# Patient Record
Sex: Female | Born: 1965 | Race: White | Hispanic: Yes | Smoking: Never smoker
Health system: Southern US, Community
[De-identification: ages and names within clinical notes are randomized; demographics above are authoritative.]

## PROBLEM LIST (undated history)

## (undated) DIAGNOSIS — M1990 Unspecified osteoarthritis, unspecified site: Secondary | ICD-10-CM

## (undated) HISTORY — PX: KNEE SURGERY: SHX244

---

## 2002-06-12 ENCOUNTER — Emergency Department (HOSPITAL_COMMUNITY): Admission: EM | Admit: 2002-06-12 | Discharge: 2002-06-12 | Payer: Self-pay | Admitting: Emergency Medicine

## 2006-11-03 ENCOUNTER — Ambulatory Visit: Payer: Self-pay | Admitting: Family Medicine

## 2006-11-06 ENCOUNTER — Ambulatory Visit: Payer: Self-pay | Admitting: *Deleted

## 2006-11-24 ENCOUNTER — Ambulatory Visit: Payer: Self-pay | Admitting: Family Medicine

## 2007-05-03 ENCOUNTER — Encounter (INDEPENDENT_AMBULATORY_CARE_PROVIDER_SITE_OTHER): Payer: Self-pay | Admitting: Family Medicine

## 2007-05-03 DIAGNOSIS — M25519 Pain in unspecified shoulder: Secondary | ICD-10-CM

## 2013-11-29 ENCOUNTER — Emergency Department (HOSPITAL_COMMUNITY)
Admission: EM | Admit: 2013-11-29 | Discharge: 2013-11-30 | Disposition: A | Discharge status: Home / Self Care | Payer: Self-pay | Attending: Emergency Medicine | Admitting: Emergency Medicine

## 2013-11-29 ENCOUNTER — Encounter (HOSPITAL_COMMUNITY): Payer: Self-pay | Admitting: Emergency Medicine

## 2013-11-29 ENCOUNTER — Emergency Department (HOSPITAL_COMMUNITY): Payer: Self-pay

## 2013-11-29 DIAGNOSIS — R112 Nausea with vomiting, unspecified: Secondary | ICD-10-CM | POA: Insufficient documentation

## 2013-11-29 DIAGNOSIS — R42 Dizziness and giddiness: Secondary | ICD-10-CM | POA: Insufficient documentation

## 2013-11-29 DIAGNOSIS — R1110 Vomiting, unspecified: Secondary | ICD-10-CM

## 2013-11-29 DIAGNOSIS — R197 Diarrhea, unspecified: Secondary | ICD-10-CM | POA: Insufficient documentation

## 2013-11-29 DIAGNOSIS — H5500 Unspecified nystagmus: Secondary | ICD-10-CM | POA: Insufficient documentation

## 2013-11-29 LAB — BASIC METABOLIC PANEL
BUN: 15 mg/dL (ref 6–23)
CO2: 24 mEq/L (ref 19–32)
Calcium: 9.2 mg/dL (ref 8.4–10.5)
Chloride: 102 mEq/L (ref 96–112)
Creatinine, Ser: 0.84 mg/dL (ref 0.50–1.10)
GFR calc non Af Amer: 81 mL/min — ABNORMAL LOW (ref >90)
Glucose, Bld: 99 mg/dL (ref 70–99)
POTASSIUM: 4.1 meq/L (ref 3.7–5.3)
Sodium: 137 mEq/L (ref 137–147)

## 2013-11-29 LAB — CBC WITH DIFFERENTIAL/PLATELET
Basophils Absolute: 0 10*3/uL (ref 0.0–0.1)
Basophils Relative: 0 % (ref 0–1)
Eosinophils Absolute: 0.1 10*3/uL (ref 0.0–0.7)
Eosinophils Relative: 1 % (ref 0–5)
HEMATOCRIT: 36.1 % (ref 36.0–46.0)
HEMOGLOBIN: 12.4 g/dL (ref 12.0–15.0)
Lymphocytes Relative: 35 % (ref 12–46)
Lymphs Abs: 2.2 10*3/uL (ref 0.7–4.0)
MCH: 30.5 pg (ref 26.0–34.0)
MCHC: 34.3 g/dL (ref 30.0–36.0)
MCV: 88.7 fL (ref 78.0–100.0)
MONO ABS: 0.3 10*3/uL (ref 0.1–1.0)
MONOS PCT: 5 % (ref 3–12)
Neutro Abs: 3.6 10*3/uL (ref 1.7–7.7)
Neutrophils Relative %: 58 % (ref 43–77)
Platelets: 277 10*3/uL (ref 150–400)
RBC: 4.07 MIL/uL (ref 3.87–5.11)
RDW: 13.8 % (ref 11.5–15.5)
WBC: 6.3 10*3/uL (ref 4.0–10.5)

## 2013-11-29 LAB — URINALYSIS, ROUTINE W REFLEX MICROSCOPIC
BILIRUBIN URINE: NEGATIVE
Glucose, UA: NEGATIVE mg/dL
Hgb urine dipstick: NEGATIVE
Ketones, ur: NEGATIVE mg/dL
Leukocytes, UA: NEGATIVE
NITRITE: NEGATIVE
PH: 6.5 (ref 5.0–8.0)
Protein, ur: NEGATIVE mg/dL
Specific Gravity, Urine: 1.011 (ref 1.005–1.030)
Urobilinogen, UA: 0.2 mg/dL (ref 0.0–1.0)

## 2013-11-29 MED ORDER — DIPHENOXYLATE-ATROPINE 2.5-0.025 MG PO TABS: 2.0000 | ORAL_TABLET | Freq: Four times a day (QID) | ORAL | Status: DC | PRN

## 2013-11-29 MED ORDER — SODIUM CHLORIDE 0.9 % IV BOLUS (SEPSIS)
1000.0000 mL | Freq: Once | INTRAVENOUS | Status: AC
  Administered 2013-11-29: 1000 mL via INTRAVENOUS

## 2013-11-29 MED ORDER — DIAZEPAM 5 MG/ML IJ SOLN
5.0000 mg | Freq: Once | INTRAMUSCULAR | Status: AC
  Administered 2013-11-29: 5 mg via INTRAVENOUS
  Filled 2013-11-29: qty 2

## 2013-11-29 MED ORDER — ONDANSETRON HCL 4 MG/2ML IJ SOLN
4.0000 mg | Freq: Once | INTRAMUSCULAR | Status: AC
  Administered 2013-11-29: 4 mg via INTRAVENOUS
  Filled 2013-11-29: qty 2

## 2013-11-29 MED ORDER — ONDANSETRON 4 MG PO TBDP
8.0000 mg | ORAL_TABLET | Freq: Once | ORAL | Status: AC
  Administered 2013-11-29: 8 mg via ORAL
  Filled 2013-11-29: qty 2

## 2013-11-29 MED ORDER — PROMETHAZINE HCL 25 MG PO TABS: 25.0000 mg | ORAL_TABLET | Freq: Four times a day (QID) | ORAL | Status: DC | PRN

## 2013-11-29 MED ORDER — MECLIZINE HCL 25 MG PO TABS: 25.0000 mg | ORAL_TABLET | Freq: Three times a day (TID) | ORAL | Status: DC | PRN

## 2013-11-29 NOTE — ED Notes (Addendum)
C/o dizziness. Whenever she feels dizzy her head hurts. Constant, but fluctuates. Describes as pressure. Denies facial pain/pressure. Can't move fast b/c she gets more dizzy. Admits to nvd. Denies fever. Last emesis PTA. Last diarrhea today. nvd onset Wednesday. Language barrier. Family translating well.  Agreeable to language line interpreter service when offered.

## 2013-11-29 NOTE — ED Notes (Signed)
MD at bedside. 

## 2013-11-29 NOTE — Discharge Instructions (Signed)
Diarrea  (Diarrhea) La diarrea es la materia fecal (heces) acuosas. Puede hacerlo sentir dbil, cansado, sediento, o con la boca seca (signos de deshidratacin). La materia fecal acuosa es signo de otro problema, generalmente una infeccin. Suele durar 2 o 3 das. Puede durar ms tiempo si es el signo de una enfermedad grave. Cudese segn lo que le indique su mdico.  CUIDADOS EN EL HOGAR   Beba 1 taza (8 onzas) de lquido cada vez que tenga una deposicin acuosa.  No beba los siguientes lquidos:  Los que contengan azcares simples (fructosa, glucosa, galactosa, [place], sacarosa, maltosa).  Bebidas deportivas.  Jugos de fruta.  Productos lcteos enteros.  Gaseosas.  Bebidas con cafena (caf, t, gaseosas) o alcohol.  Puede usar soluciones de rehidratacin oral si su mdico lo autoriza. Usted mismo puede preparar la solucin. Siga esta receta:   -  cucharadita de sal.   cucharadita de bicarbonato de soda.   de cucharadita de sal sustituta que contenga cloruro de potasio.  1  cucharada de azcar.  1 litros (34 oz) de agua.  Evite los siguientes alimentos:  Los que tienen gran contenido de [place], como frutas y verduras.  Frutas secas, semillas y panes y cereales integrales.  Las endulzadas con alcohol de azcar (xylitol, sorbitol, manitol).  Trate de comer los siguientes alimentos:  Los que tienen almidn, como arroz, pan, pasta, cereales bajos en azcar, avena, smola de maz, papas al horno, galletas y panecillos.  Bananas.  Pur de [employer].  Consuma alimentos ricos en probiticos, como yogur y productos lcteos fermentados.  Lvese bien las manos cada vez que tenga una deposicin acuosa.  Slo tome los medicamentos que le haya indicado su mdico.  Tome un bao caliente para ayudar a Engineer, manufacturing systems ardor o dolor que producen las deposiciones aguadas. SOLICITE AYUDA DE INMEDIATO SI:   No puede beber lquidos sin devolver vomitar.  Sigue vomitando.  Tiene  sangre en la materia fecal, o es de color negro y de aspecto alquitranado.  No hay emisin de [place] durante 6 a 8 horas o elimina una pequea cantidad de [place].  Usted tiene dolor de estmago (abdominal) que empeora o se mantiene en el mismo lugar (localiza).  Se siente dbil, mareado, confundido o se desmaya.  Siente un dolor de cabeza muy intenso.  La materia fecal acuosa no mejora.  Tiene fiebre o sntomas que persisten durante ms de 2-3 das.  Tiene fiebre y los sntomas empeoran de manera sbita. ASEGRESE DE QUE:   Comprende estas instrucciones.  Controlar su enfermedad.  Solicitar ayuda de inmediato si no mejora o si empeora. Document Released: 08/08/2012 [facility] Patient Information 2014 [place], [place].  Nuseas y vmitos (Nausea and Vomiting)  Naseas significa que tiene ganas de vomitar. Elvmitoes un reflejo por el que los contenidos del estmago salen por la boca.  CUIDADOS EN EL HOGAR  Tome los medicamentos como le indic el mdico.  No se esfuerce por comer. Sin embargo, es necesario que tome lquidos.  Si tiene ganas de comer, [place] dieta normal, segn las indicaciones del mdico.  Coma arroz, trigo, patatas, pan, carnes magras, yogur, frutas y vegetales.  Evite los alimentos [employer].  Beba gran cantidad de lquidos para mantener la orina de tono claro o color amarillo plido.  Consulte a su mdico como reponer la prdida de lquidos (rehidratacin). Los signos de prdida de lquidos (deshidratacin) son:  Chief of Staff sed.  Tiene los labios y la boca secos.  Est mareado.  La orina es Foster Centeroscura.  Orina menos que lo normal.  Se siente confundido.  La respiracin o la frecuencia cardaca son rpidas. SOLICITE AYUDA DE INMEDIATO SI:  Observa sangre en su vmito.  La materia fecal (heces)es negra o de color rojo.  Siente un dolor de cabeza muy intenso o el cuello rgido.  Se siente confundido.  Siente dolor muy fuerte  en el vientre (abdominal).  Tiene dolor en el pecho o dificultad para respirar.  No ha orinado durante 8 horas.  Tiene la piel fra y pegajosa.  Sigue vomitando despus de 24  48 horas.  Tiene fiebre. ASEGRESE QUE:  Comprende estas instrucciones.  Controlar la enfermedad.  Puede solicitar ayuda de inmediato si los sntomas no mejoran, o si empeoran. Document Released: 02/09/2010 Document Revised: 11/14/2011 [facility] Patient Information 2014 [place], [place].  Vrtigo (Vertigo)  Vrtigo es la sensacin de que se est moviendo estando quieto. Puede sentir Starwood Hotelsque las cosas a su alrededor se mueven cuando no lo hacen. Este problema desaparece por s mismo.  CUIDADOS EN EL HOGAR   Siga las indicaciones del mdico.  Evite conducir vehculos.  Evite usar Anheuser-Buschmaquinarias pesadas.  Evite realizar Anadarko Petroleum Corporationactividades que puedan ser peligrosas.  Informe a su mdico si los medicamentos le causan vrtigo. SOLICITE AYUDA DE INMEDIATO SI:   Los medicamentos no lo ayudan o lo Dietitianhacen sentir peor.  Tiene dificultad para hablar o para caminar.  Se siente dbil o tiene problemas para Boeingusar los brazos, las manos o las piernas.  Tiene dolores de [place].  Tiene Programme researcher, broadcasting/film/videomalestar estomacal (nuseas) y vmitos.  Hay cambios en la visin.  Algn familiar nota que usted tiene Lehman Brotherscambios en su conducta.  Los sntomas empeoran. ASEGRESE DE QUE:  Comprende estas instrucciones.  Controlar su enfermedad.  Solicitar ayuda de inmediato si no mejora o empeora. Document Released: 09/24/2010 Document Revised: 11/14/2011 [facility] Patient Information 2014 [place], [place].

## 2013-11-29 NOTE — ED Provider Notes (Signed)
CSN: [id number]     Arrival date & time 11/29/13  2009 History   First MD Initiated Contact with Patient 11/29/13 2040     Chief Complaint  Patient presents with  . Headache  . Dizziness     (Consider location/radiation/quality/duration/timing/severity/associated sxs/prior Treatment) HPI Comments: Patient has had nausea, vomiting, diarrhea for the last 2 or 3 days. She is not experiencing abdominal pain. This has begun to experience severe vertiginous dizziness. These dizziness symptoms come and go. The dizziness seems to worsen when she changes position or moves her head. She has had some mild pain that she describes as a "pressure" associated with the dizziness. No chest pain, palpitations or shortness of breath.  Patient is a 48 y.o. female presenting with headaches and dizziness.  Headache Associated symptoms: diarrhea, dizziness, nausea and vomiting   Associated symptoms: no abdominal pain   Dizziness Associated symptoms: diarrhea, headaches, nausea and vomiting     History reviewed. No pertinent past medical history. Past Surgical History  Procedure Laterality Date  . Cesarean section     No family history on file. History  Substance Use Topics  . Smoking status: Never Smoker   . Smokeless tobacco: Not on file  . Alcohol Use: No   OB History   Grav Para Term Preterm Abortions TAB SAB Ect Mult Living                 Review of Systems  Gastrointestinal: Positive for nausea, vomiting and diarrhea. Negative for abdominal pain and anal bleeding.  Neurological: Positive for dizziness and headaches.  All other systems reviewed and are negative.      Allergies  Review of patient's allergies indicates no known allergies.  Home Medications  No current outpatient prescriptions on file. BP 128/65  Pulse 57  Temp(Src) 98.1 F (36.7 C) (Oral)  Resp 11  SpO2 100%  LMP 11/01/2013 Physical Exam  Constitutional: She is oriented to person, place, and time. She appears  well-developed and well-nourished. No distress.  HENT:  Head: Normocephalic and atraumatic.  Right Ear: Hearing normal.  Left Ear: Hearing normal.  Nose: Nose normal.  Mouth/Throat: Oropharynx is clear and moist and mucous membranes are normal.  Eyes: Conjunctivae are normal. Pupils are equal, round, and reactive to light. Right eye exhibits nystagmus (Lateral). Left eye exhibits nystagmus (lateral).  Neck: Normal range of motion. Neck supple.  Cardiovascular: Regular rhythm, S1 normal and S2 normal.  Exam reveals no gallop and no friction rub.   No murmur heard. Pulmonary/Chest: Effort normal and breath sounds normal. No respiratory distress. She exhibits no tenderness.  Abdominal: Soft. Normal appearance and bowel sounds are normal. There is no hepatosplenomegaly. There is no tenderness. There is no rebound, no guarding, no tenderness at McBurney's point and negative Murphy's sign. No hernia.  Musculoskeletal: Normal range of motion.  Neurological: She is alert and oriented to person, place, and time. She has normal strength. No cranial nerve deficit or sensory deficit. Coordination normal. GCS eye subscore is 4. GCS verbal subscore is 5. GCS motor subscore is 6.  Skin: Skin is warm, dry and intact. No rash noted. No cyanosis.  Psychiatric: She has a normal mood and affect. Her speech is normal and behavior is normal. Thought content normal.    ED Course  Procedures (including critical care time) Labs Review Labs Reviewed  BASIC METABOLIC PANEL - Abnormal; Notable for the following:    GFR calc non Af Amer 81 (*)    All other components  within normal limits  CBC WITH DIFFERENTIAL  URINALYSIS, ROUTINE W REFLEX MICROSCOPIC  POC URINE PREG, ED   Imaging Review Ct Head Wo Contrast  11/29/2013   CLINICAL DATA:  Dizziness and headache  EXAM: CT HEAD WITHOUT CONTRAST  TECHNIQUE: Contiguous axial images were obtained from the base of the skull through the vertex without intravenous  contrast.  COMPARISON:  None available  FINDINGS: There is no acute intracranial hemorrhage or infarct. No mass lesion or midline shift. Gray-white matter differentiation is well maintained. Ventricles are normal in size without evidence of hydrocephalus. CSF containing spaces are within normal limits. No extra-axial fluid collection.  The calvarium is intact.  Orbital soft tissues are within normal limits.  The paranasal sinuses and mastoid air cells are well pneumatized and free of fluid.  Scalp soft tissues are unremarkable.  IMPRESSION: Normal head CT with no acute intracranial process identified.   Electronically Signed   By: Rise Mu M.D.   On: 11/29/2013 21:53     EKG Interpretation None      MDM   Final diagnoses:  None   Patient presented to the ER for evaluation of mild headache with dizziness. Symptoms began after a several days of nausea, vomiting and diarrhea. She had an unremarkable neurologic exam. Workup in the emergency department, including CT head, was negative. She is improved with treatment. Patient will be discharged with symptomatic care.  Gilda Crease, MD 11/30/13 [phone]

## 2020-12-10 ENCOUNTER — Ambulatory Visit (INDEPENDENT_AMBULATORY_CARE_PROVIDER_SITE_OTHER): Payer: Self-pay

## 2020-12-10 ENCOUNTER — Ambulatory Visit (HOSPITAL_COMMUNITY)
Admission: EM | Admit: 2020-12-10 | Discharge: 2020-12-10 | Disposition: A | Discharge status: Home / Self Care | Payer: Self-pay | Attending: Urgent Care | Admitting: Urgent Care

## 2020-12-10 ENCOUNTER — Other Ambulatory Visit: Payer: Self-pay

## 2020-12-10 ENCOUNTER — Encounter (HOSPITAL_COMMUNITY): Payer: Self-pay | Admitting: Emergency Medicine

## 2020-12-10 DIAGNOSIS — M25511 Pain in right shoulder: Secondary | ICD-10-CM

## 2020-12-10 DIAGNOSIS — M62838 Other muscle spasm: Secondary | ICD-10-CM

## 2020-12-10 DIAGNOSIS — M542 Cervicalgia: Secondary | ICD-10-CM

## 2020-12-10 DIAGNOSIS — M79601 Pain in right arm: Secondary | ICD-10-CM

## 2020-12-10 HISTORY — DX: Unspecified osteoarthritis, unspecified site: M19.90

## 2020-12-10 MED ORDER — PREDNISONE 20 MG PO TABS: ORAL_TABLET | ORAL | 0 refills | Status: DC

## 2020-12-10 NOTE — ED Provider Notes (Signed)
[facility]   MRN: [id number] DOB: 1966/08/20  Subjective:   [facility] Chen is a 55 y.o. female presenting for 3-week history of persistent right trapezius tightness, pain that radiates into the right shoulder, has associated popping and also radiates into the right upper arm with associated tingling/burning sensation.  Has also had some intermittent buttock pain on either side that radiates into her right or left leg.  Her more prominent symptom is of the right base of her neck, right shoulder and arm.  Has been using naproxen with very temporary relief.  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  No current facility-administered medications for this encounter.  Current Outpatient Medications:  .  diphenoxylate-atropine (LOMOTIL) 2.5-0.025 MG per tablet, Take 2 tablets by mouth 4 (four) times daily as needed for diarrhea or loose stools., Disp: 30 tablet, Rfl: 0 .  meclizine (ANTIVERT) 25 MG tablet, Take 1 tablet (25 mg total) by mouth 3 (three) times daily as needed for dizziness., Disp: 30 tablet, Rfl: 0 .  promethazine (PHENERGAN) 25 MG tablet, Take 1 tablet (25 mg total) by mouth every 6 (six) hours as needed for nausea or vomiting., Disp: 30 tablet, Rfl: 0   No Known Allergies  Past Medical History:  Diagnosis Date  . Arthritis      Past Surgical History:  Procedure Laterality Date  . CESAREAN SECTION    . KNEE SURGERY      History reviewed. No pertinent family history.  Social History   Tobacco Use  . Smoking status: Never Smoker  . Smokeless tobacco: Never Used  Vaping Use  . Vaping Use: Never used  Substance Use Topics  . Alcohol use: No  . Drug use: No    ROS   Objective:   Vitals: BP 130/81 (BP Location: Left Arm)   Pulse 72   Temp 98.4 F (36.9 C) (Oral)   Resp 18   SpO2 100%   Physical Exam Constitutional:      General: She is not in acute distress.     Appearance: Normal appearance. She is well-developed. She is not ill-appearing, toxic-appearing or diaphoretic.  HENT:     Head: Normocephalic and atraumatic.     Nose: Nose normal.     Mouth/Throat:     Mouth: Mucous membranes are moist.     Pharynx: Oropharynx is clear.  Eyes:     General: No scleral icterus.       Right eye: No discharge.        Left eye: No discharge.     Extraocular Movements: Extraocular movements intact.     Conjunctiva/sclera: Conjunctivae normal.     Pupils: Pupils are equal, round, and reactive to light.  Cardiovascular:     Rate and Rhythm: Normal rate.  Pulmonary:     Effort: Pulmonary effort is normal.  Musculoskeletal:     Right shoulder: Tenderness (movement pain of trapezius with abduction) present. No swelling, deformity, effusion, laceration, bony tenderness or crepitus. Decreased range of motion (above 90 degrees). Normal strength. Normal pulse.     Right upper arm: No swelling, edema, deformity, lacerations, tenderness or bony tenderness.     Cervical back: Spasms and tenderness present. No swelling, edema, deformity, erythema, signs of trauma, lacerations, rigidity, torticollis, bony tenderness or crepitus. Pain with movement present. Normal range of motion.     Lumbar back: No swelling, edema, deformity, signs of trauma, lacerations, spasms, tenderness  or bony tenderness. Normal range of motion. Positive left straight leg raise test. Negative right straight leg raise test. No scoliosis.  Skin:    General: Skin is warm and dry.  Neurological:     General: No focal deficit present.     Mental Status: She is alert and oriented to person, place, and time.     Motor: No weakness.     Coordination: Coordination normal.     Gait: Gait normal.     Deep Tendon Reflexes: Reflexes normal.  Psychiatric:        Mood and Affect: Mood normal.        Behavior: Behavior normal.        Thought Content: Thought content normal.        Judgment: Judgment normal.      DG Shoulder Right  Result Date: 12/10/2020 CLINICAL DATA:  55 year old female with right shoulder pain. EXAM: RIGHT SHOULDER - 2+ VIEW COMPARISON:  None. FINDINGS: There is no evidence of fracture or dislocation. There is no evidence of arthropathy or other focal bone abnormality. Soft tissues are unremarkable. IMPRESSION: No acute fracture or malalignment. No significant degenerative change. Electronically Signed   By: Marliss Coots MD   On: 12/10/2020 15:31     Assessment and Plan :   PDMP not reviewed this encounter.  1. Acute pain of right shoulder   2. Trapezius muscle spasm   3. Neck pain   4. Right arm pain     Recommended an oral prednisone course that she has not responded fully to naproxen.  Counseled on back care, recommended she hydrate well especially when she goes to work.  Follow-up with Ortho if symptoms persist. Counseled patient on potential for adverse effects with medications prescribed/recommended today, ER and return-to-clinic precautions discussed, patient verbalized understanding.    Wallis Bamberg, [place] 12/10/20 1625

## 2020-12-10 NOTE — ED Triage Notes (Addendum)
Ht arm pain started 3 weeks ago.  No known injury pain starts in upper right back, goes across top of should and is the worst pain at upper right arm, just below shoulder.  Feels pop with movement and this makes pain worse

## 2021-10-12 IMAGING — DX DG SHOULDER 2+V*R*
4 series · 4 of 4 positions shown · non-contrast
Comparison: None.

CLINICAL DATA: 54-year-old female with right shoulder pain.

EXAM:
RIGHT SHOULDER - 2+ VIEW

[shoulder ap]
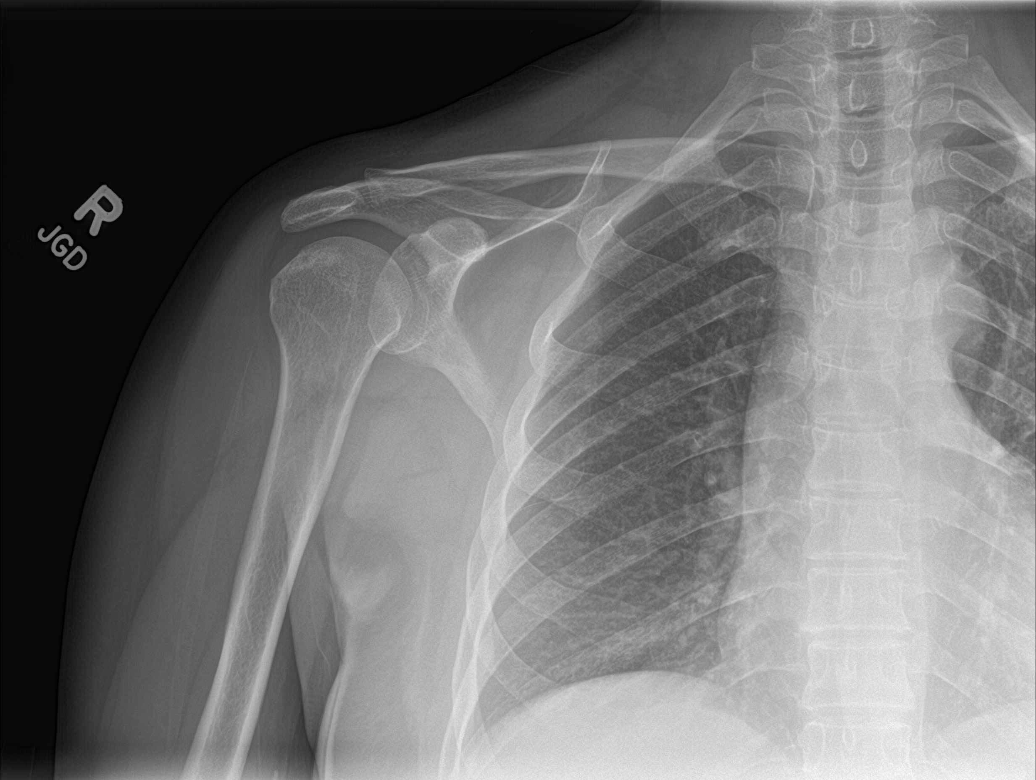

[shoulder grashey]
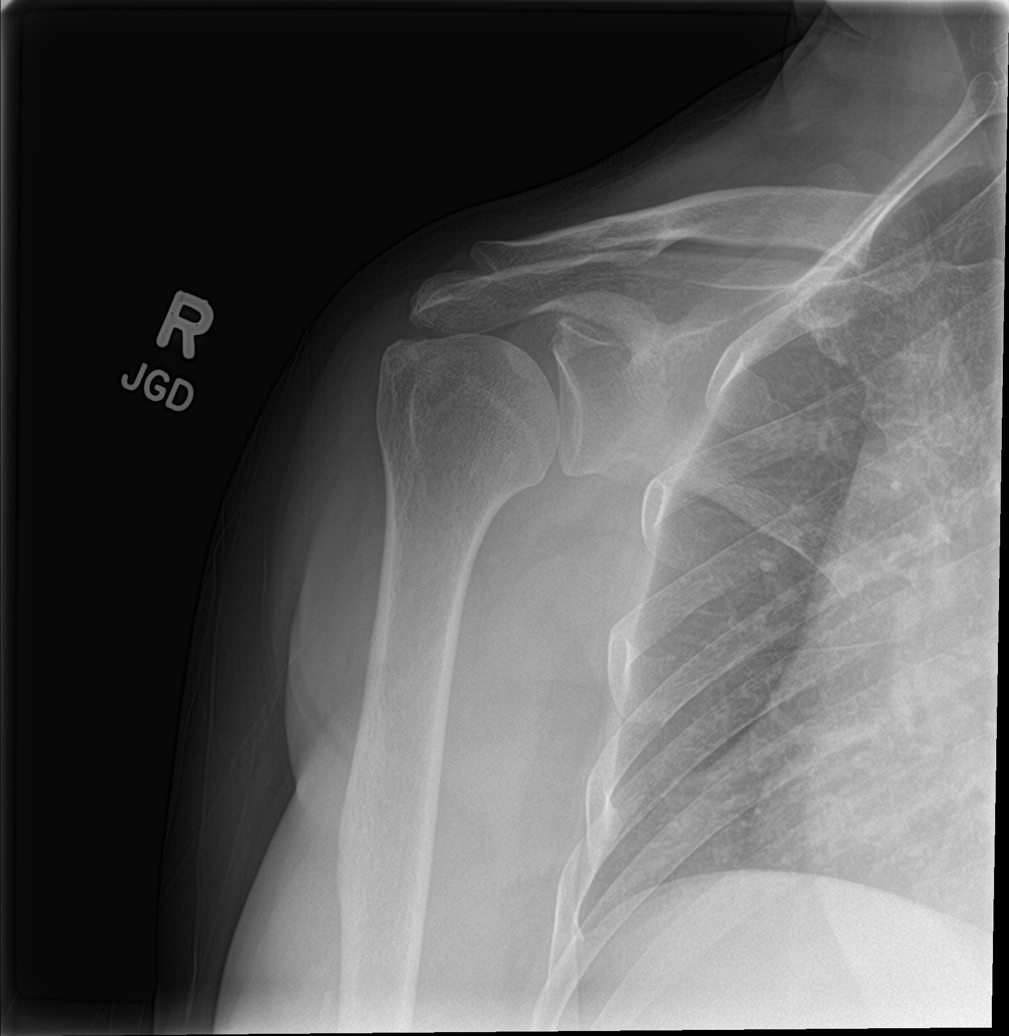

[shoulder y-view]
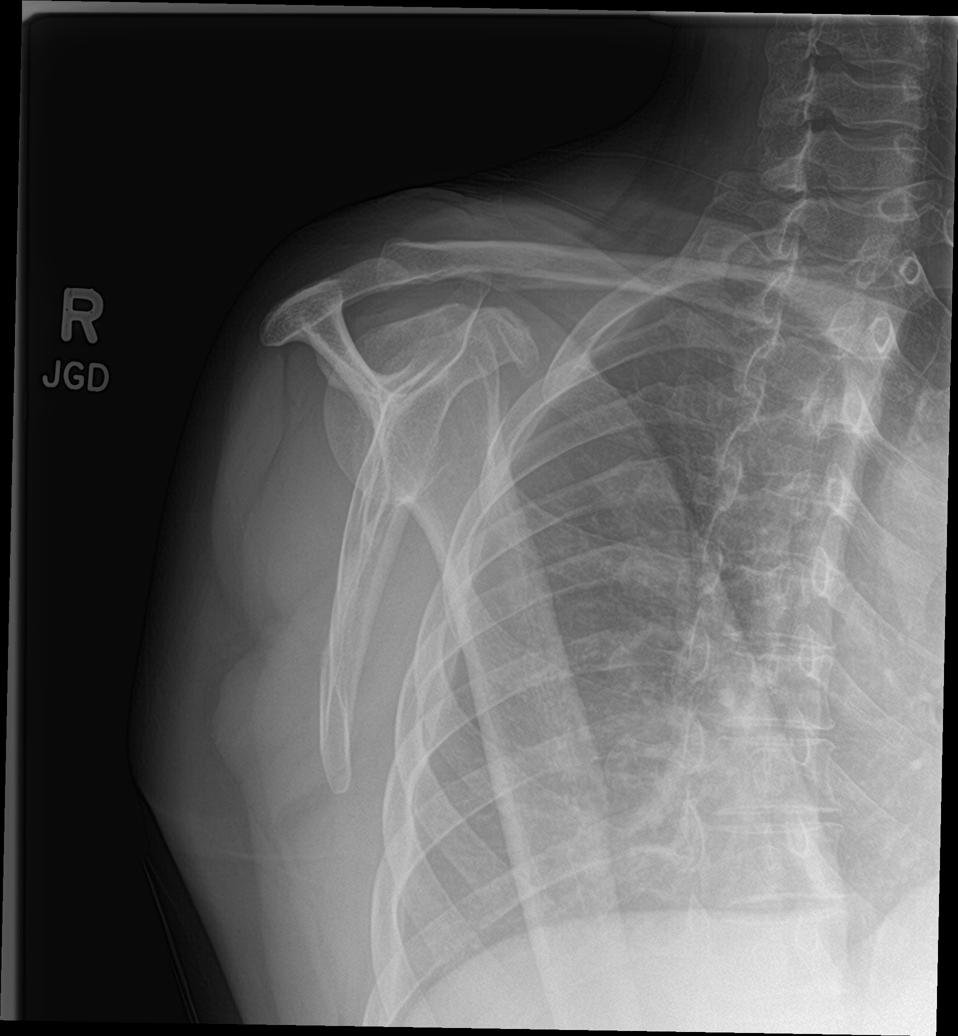

[shoulder axial]
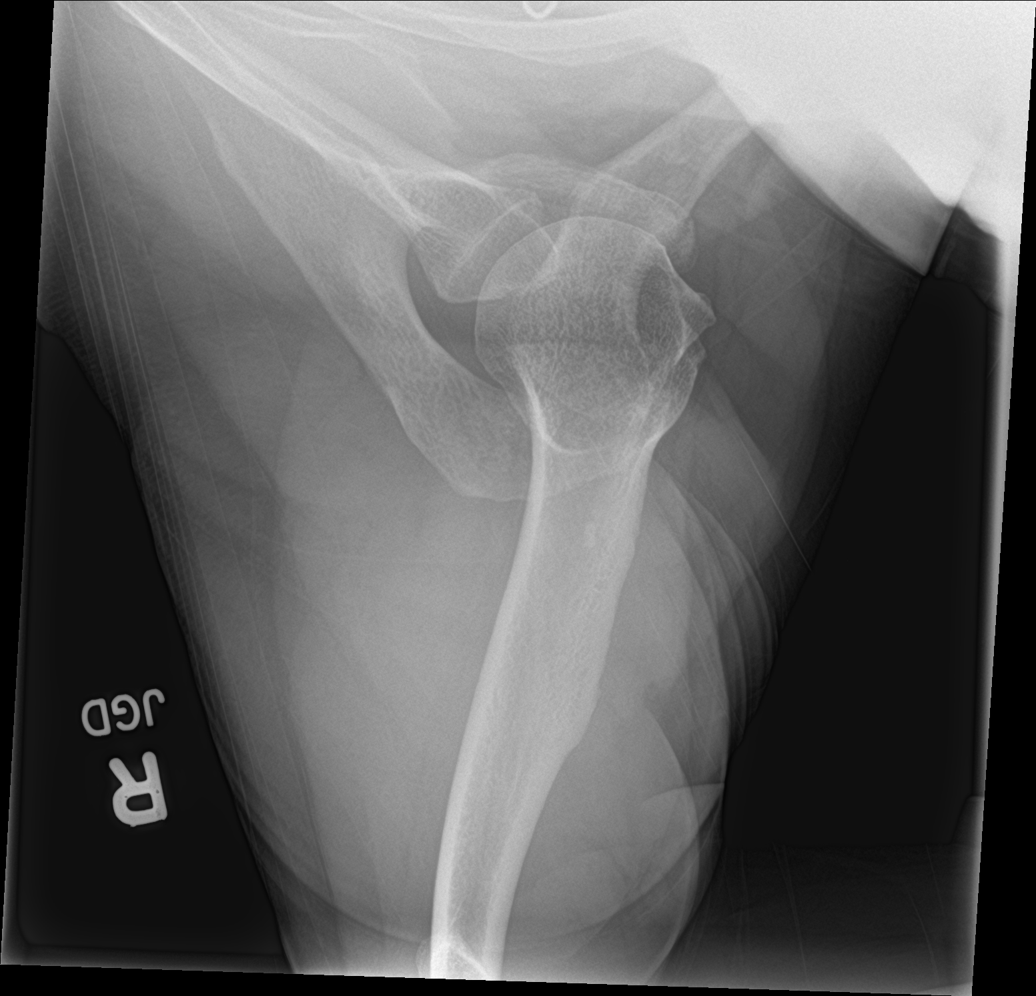

[4 of 4 positions shown; findings below may reference images not displayed]

FINDINGS: There is no evidence of fracture or dislocation. There is no
evidence of arthropathy or other focal bone abnormality. Soft
tissues are unremarkable.
IMPRESSION: No acute fracture or malalignment. No significant degenerative
change.

## 2021-10-25 ENCOUNTER — Ambulatory Visit (HOSPITAL_COMMUNITY)
Admission: EM | Admit: 2021-10-25 | Discharge: 2021-10-25 | Disposition: A | Discharge status: Home / Self Care | Payer: Worker's Compensation | Attending: Family Medicine | Admitting: Family Medicine

## 2021-10-25 ENCOUNTER — Other Ambulatory Visit: Payer: Self-pay

## 2021-10-25 ENCOUNTER — Encounter (HOSPITAL_COMMUNITY): Payer: Self-pay | Admitting: *Deleted

## 2021-10-25 DIAGNOSIS — S0990XA Unspecified injury of head, initial encounter: Secondary | ICD-10-CM

## 2021-10-25 NOTE — ED Provider Notes (Signed)
[facility]    CSN: [id number] Arrival date & time: 10/25/21  1220      History   Chief Complaint Chief Complaint  Patient presents with   Head Injury    HPI Megan Chen is a 56 y.o. female.    Head Injury Here for headache.  On February 17 she was at work when she was struck in the head by a window falling on the anterior portion of her scalp.  The window fell from about 3 feet above her, but she states she was also arising from a kneeling position.  No loss of consciousness.  Since then she has had a pressure feeling in her head and has had blurriness of vision at times.  She is not taking any blood thinners, and states she has no chronic illnesses.  Past Medical History:  Diagnosis Date   Arthritis     Patient Active Problem List   Diagnosis Date Noted   SHOULDER PAIN, BILATERAL 05/03/2007    Past Surgical History:  Procedure Laterality Date   CESAREAN SECTION     KNEE SURGERY      OB History   No obstetric history on file.      Home Medications    Prior to Admission medications   Medication Sig Start Date End Date Taking? Authorizing Provider  diphenoxylate-atropine (LOMOTIL) 2.5-0.025 MG per tablet Take 2 tablets by mouth 4 (four) times daily as needed for diarrhea or loose stools. 11/29/13   Orpah Greek, MD  meclizine (ANTIVERT) 25 MG tablet Take 1 tablet (25 mg total) by mouth 3 (three) times daily as needed for dizziness. 11/29/13   Orpah Greek, MD  predniSONE (DELTASONE) 20 MG tablet Take 2 tablets daily with breakfast. 12/10/20   Jaynee Eagles, PA-C  promethazine (PHENERGAN) 25 MG tablet Take 1 tablet (25 mg total) by mouth every 6 (six) hours as needed for nausea or vomiting. 11/29/13   Pollina, Gwenyth Allegra, MD    Family History History reviewed. No pertinent family history.  Social History Social History   Tobacco Use   Smoking status: Never   Smokeless tobacco: Never  Vaping Use   Vaping Use: Never used   Substance Use Topics   Alcohol use: No   Drug use: No     Allergies   Patient has no known allergies.   Review of Systems Review of Systems   Physical Exam Triage Vital Signs ED Triage Vitals [10/25/21 1358]  Enc Vitals Group     BP (!) 149/87     Pulse Rate (!) 51     Resp 18     Temp 98.4 F (36.9 C)     Temp src      SpO2 98 %     Weight      Height      Head Circumference      Peak Flow      Pain Score 4     Pain Loc      Pain Edu?      Excl. in [place]?    No data found.  Updated Vital Signs BP (!) 149/87    Pulse (!) 51    Temp 98.4 F (36.9 C)    Resp 18    SpO2 98%   Visual Acuity Right Eye Distance:   Left Eye Distance:   Bilateral Distance:    Right Eye Near:   Left Eye Near:    Bilateral Near:     Physical Exam  Vitals reviewed.  Constitutional:      General: She is not in acute distress.    Appearance: She is not toxic-appearing.  HENT:     Head:     Comments: There is a small abrasion about half a centimeter long on the scalp the hairline above her frontal area    Right Ear: Tympanic membrane and ear canal normal.     Left Ear: Tympanic membrane and ear canal normal.     Nose: Nose normal.     Mouth/Throat:     Mouth: Mucous membranes are moist.     Pharynx: No oropharyngeal exudate or posterior oropharyngeal erythema.  Eyes:     Extraocular Movements: Extraocular movements intact.     Conjunctiva/sclera: Conjunctivae normal.     Pupils: Pupils are equal, round, and reactive to light.  Cardiovascular:     Rate and Rhythm: Normal rate and regular rhythm.     Heart sounds: No murmur heard. Pulmonary:     Effort: Pulmonary effort is normal. No respiratory distress.     Breath sounds: No wheezing, rhonchi or rales.  Musculoskeletal:     Cervical back: Neck supple.  Lymphadenopathy:     Cervical: No cervical adenopathy.  Skin:    Capillary Refill: Capillary refill takes less than 2 seconds.     Coloration: Skin is not jaundiced or  pale.  Neurological:     General: No focal deficit present.     Mental Status: She is alert and oriented to person, place, and time.     Cranial Nerves: No cranial nerve deficit.     Sensory: No sensory deficit.     Motor: No weakness.     Coordination: Coordination normal.     Gait: Gait normal.     Deep Tendon Reflexes: Reflexes normal.     Comments: Her cerebellar finger-to-nose exam is normal, but she is slow to touch my finger during the maneuver.  No dysmetria noted  Psychiatric:        Behavior: Behavior normal.     UC Treatments / Results  Labs (all labs ordered are listed, but only abnormal results are displayed) Labs Reviewed - No data to display  EKG   Radiology No results found.  Procedures Procedures (including critical care time)  Medications Ordered in UC Medications - No data to display  Initial Impression / Assessment and Plan / UC Course  I have reviewed the triage vital signs and the nursing notes.  Pertinent labs & imaging results that were available during my care of the patient were reviewed by me and considered in my medical decision making (see chart for details).     Despite her neuro exam being normal, the patient wants to be evaluated with CT.  She is going to proceed to the ER for further evaluation Final Clinical Impressions(s) / UC Diagnoses   Final diagnoses:  Injury of head, initial encounter     Discharge Instructions      Please proceed to the emergency room for further evaluation     ED Prescriptions   None    PDMP not reviewed this encounter.   Barrett Henle, MD 10/25/21 1414

## 2021-10-25 NOTE — Discharge Instructions (Addendum)
Please proceed to the emergency room for further evaluation 

## 2021-10-25 NOTE — ED Triage Notes (Signed)
Reports on Friday a window hit her in the head. Pt has a small cut to anterior scalp. Pt reports a pressure like Ha . Pt also woke up Sunday morning with blurred vision that has resolved.

## 2021-10-26 ENCOUNTER — Other Ambulatory Visit: Payer: Self-pay

## 2021-10-26 ENCOUNTER — Emergency Department (HOSPITAL_COMMUNITY): Payer: Worker's Compensation

## 2021-10-26 ENCOUNTER — Emergency Department (HOSPITAL_COMMUNITY)
Admission: EM | Admit: 2021-10-26 | Discharge: 2021-10-26 | Disposition: A | Discharge status: Home / Self Care | Payer: Worker's Compensation | Attending: Emergency Medicine | Admitting: Emergency Medicine

## 2021-10-26 ENCOUNTER — Encounter (HOSPITAL_COMMUNITY): Payer: Self-pay | Admitting: Emergency Medicine

## 2021-10-26 DIAGNOSIS — Y990 Civilian activity done for income or pay: Secondary | ICD-10-CM | POA: Insufficient documentation

## 2021-10-26 DIAGNOSIS — S060X0A Concussion without loss of consciousness, initial encounter: Secondary | ICD-10-CM

## 2021-10-26 DIAGNOSIS — S0990XA Unspecified injury of head, initial encounter: Secondary | ICD-10-CM

## 2021-10-26 DIAGNOSIS — W208XXA Other cause of strike by thrown, projected or falling object, initial encounter: Secondary | ICD-10-CM | POA: Insufficient documentation

## 2021-10-26 MED ORDER — IBUPROFEN 400 MG PO TABS
600.0000 mg | ORAL_TABLET | Freq: Once | ORAL | Status: AC
  Administered 2021-10-26: 600 mg via ORAL
  Filled 2021-10-26: qty 1

## 2021-10-26 NOTE — ED Notes (Signed)
AVS provided to and discussed with patient and family member at bedside. Pt verbalizes understanding of discharge instructions and denies any questions or concerns at this time. Pt has ride home. Pt ambulated out of department independently with steady gait.  

## 2021-10-26 NOTE — Discharge Instructions (Addendum)
Your CT scan did not show any broken bones or bleeding. Take Tylenol and ibuprofen together for severe pain every 6 hours. Return for persistent vomiting, confusion, weakness or numbness in arms or legs or new concerns.
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
## 2021-10-26 NOTE — ED Notes (Signed)
Pt transported to CT via W/C at this time.  ?

## 2022-08-28 IMAGING — CT CT HEAD W/O CM
4 series · 17 of 47 positions shown, 19 images · non-contrast
Comparison: CT head 11/29/2013

CLINICAL DATA: Head trauma.  Headache and dizziness



[Series 3: head wo · axial · 0.39mm/px · z∈[-144,-24]mm · 7 of 32 slices shown, 9 images]
[im 4/32  brain]
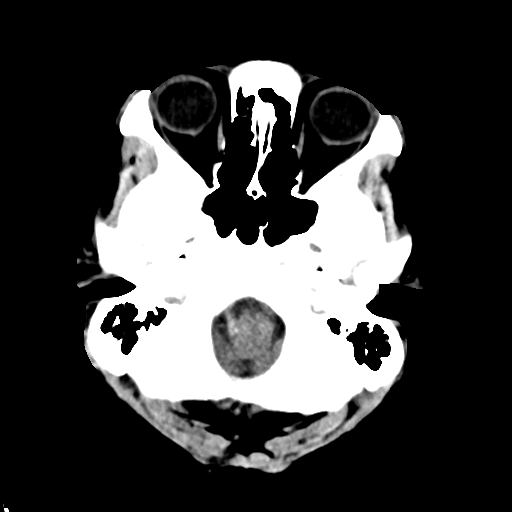
[im 4/32  bone]
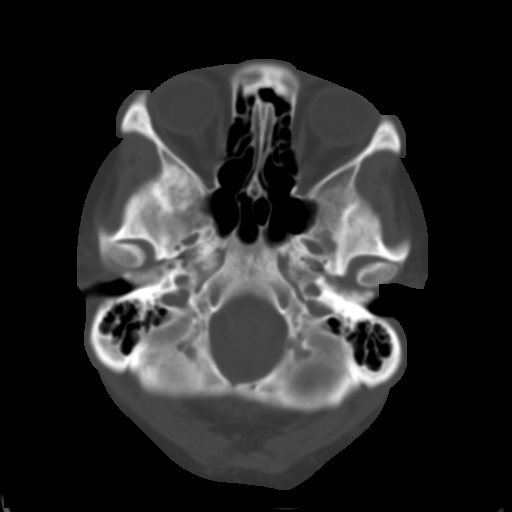
[im 8/32  brain]
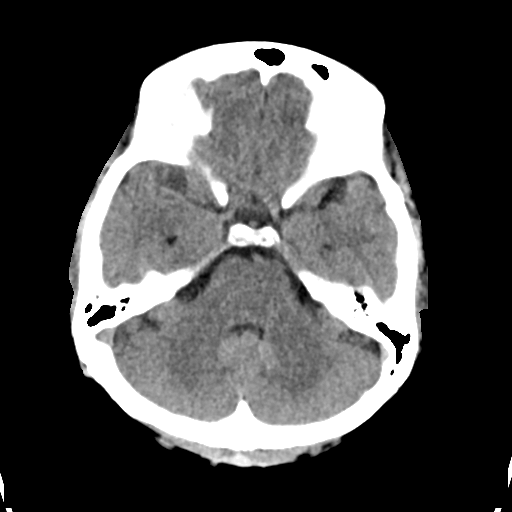
[im 12/32  brain]
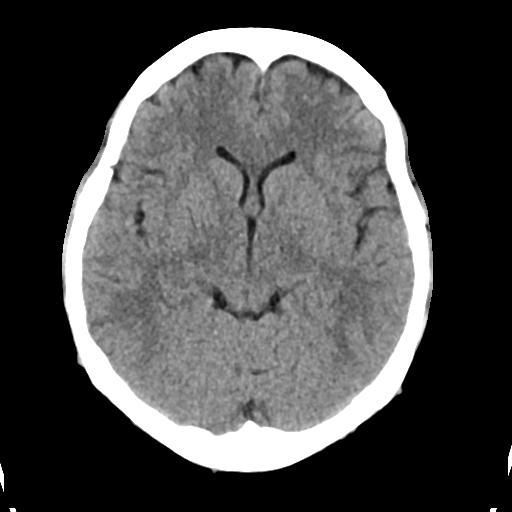
[im 16/32  brain]
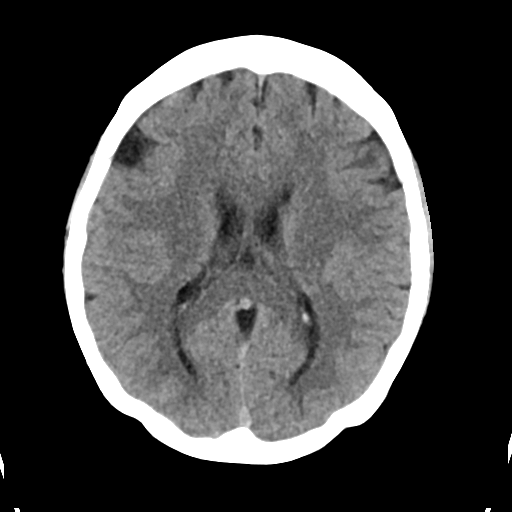
[im 20/32  brain]
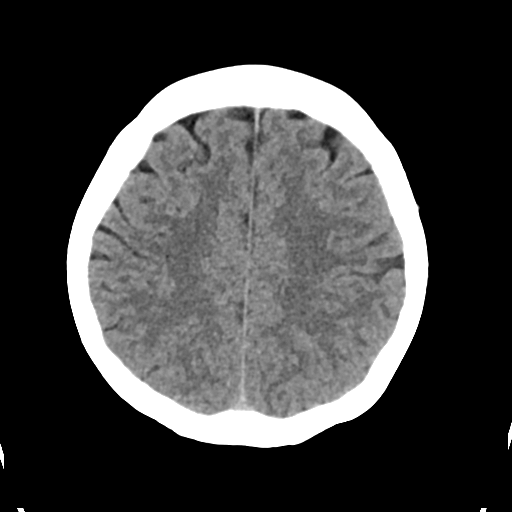
[im 20/32  bone]
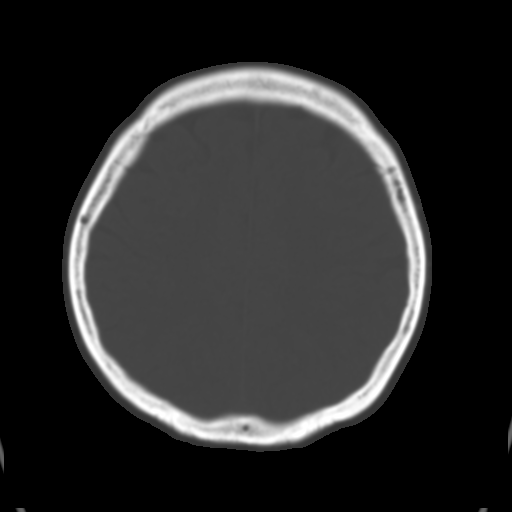
[im 24/32  brain]
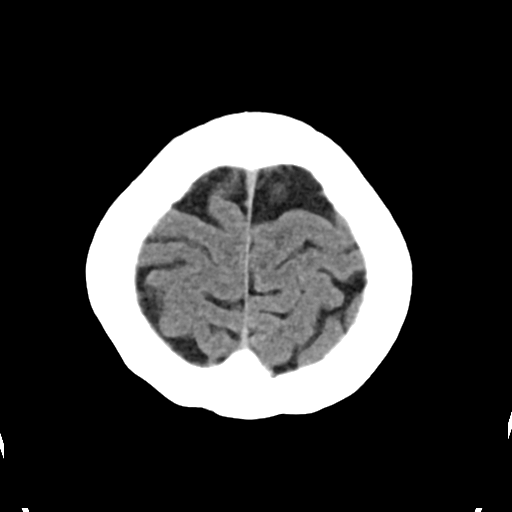
[im 28/32  brain]
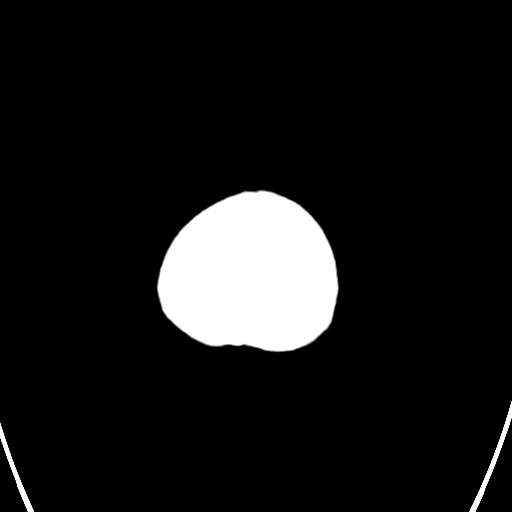

[Series 4: head bone · axial · 0.39mm/px · z∈[-145,-91]mm · 4 of 78 slices shown]
[im 8/78  bone]
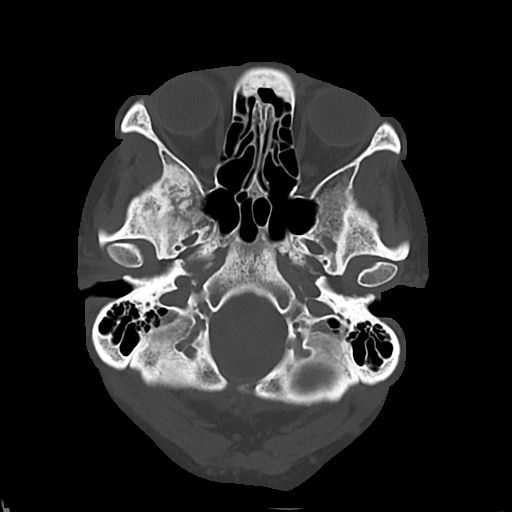
[im 16/78  bone]
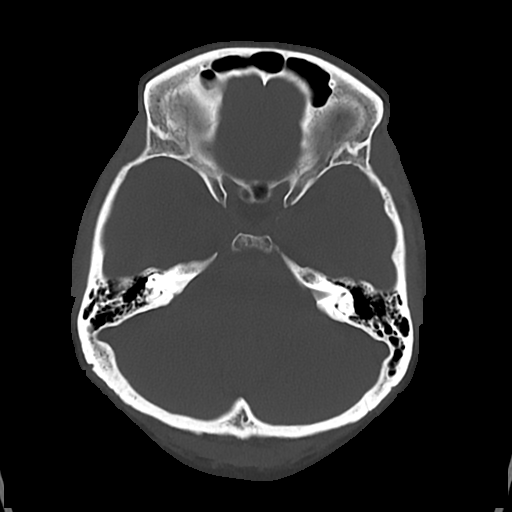
[im 24/78  bone]
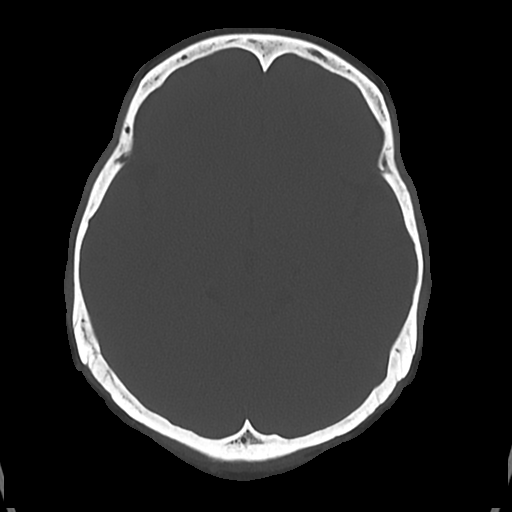
[im 35/78  bone]
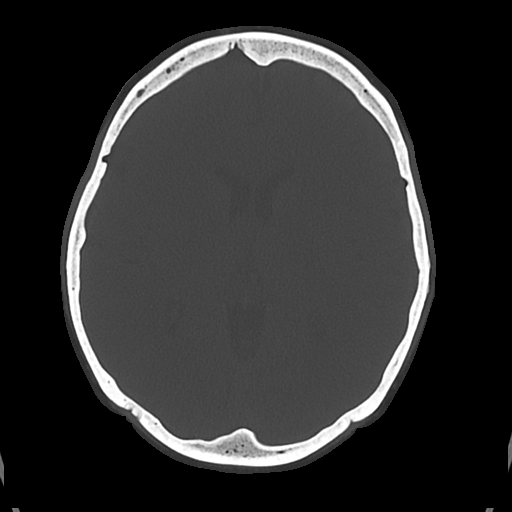

[Series 5: cor soft · coronal · 0.33mm/px · 3 of 63 slices shown]
[im 21/63  brain]
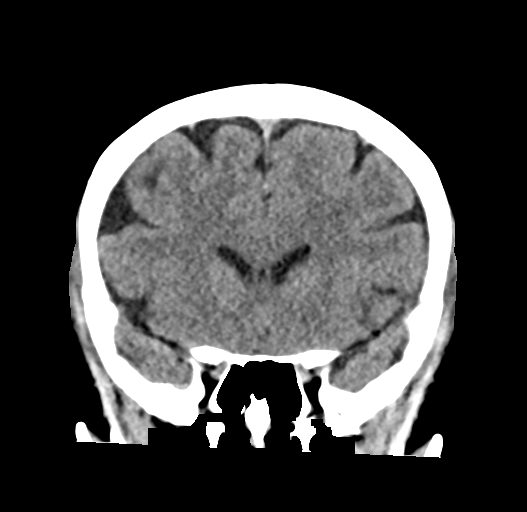
[im 28/63  brain]
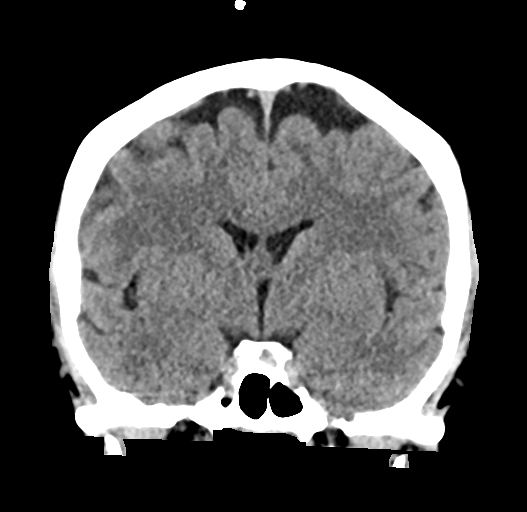
[im 35/63  brain]
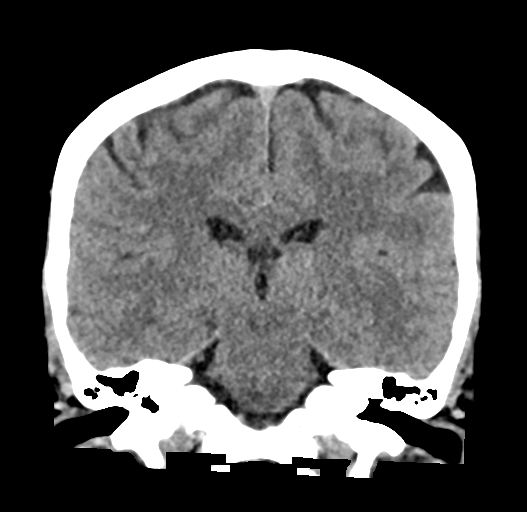

[Series 6: sag soft · sagittal · 0.33mm/px · 3 of 59 slices shown]
[im 20/59  brain]
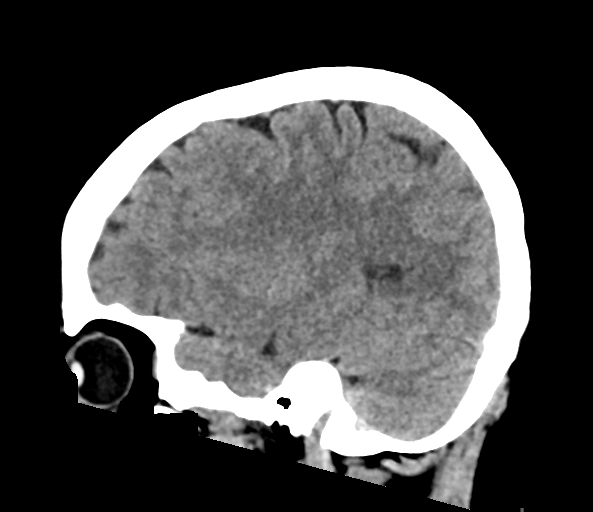
[im 30/59  brain]
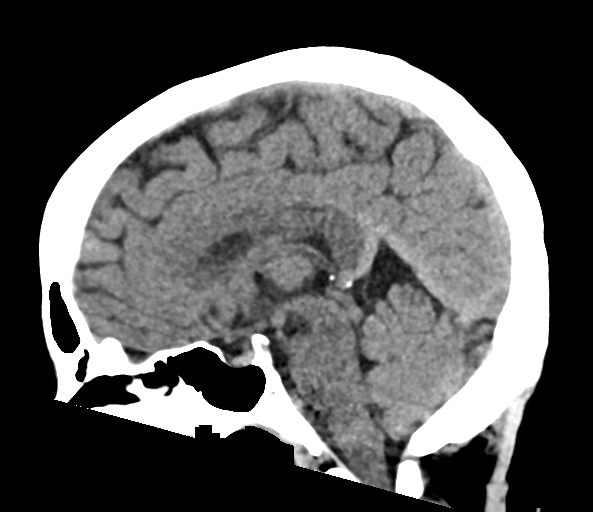
[im 39/59  brain]
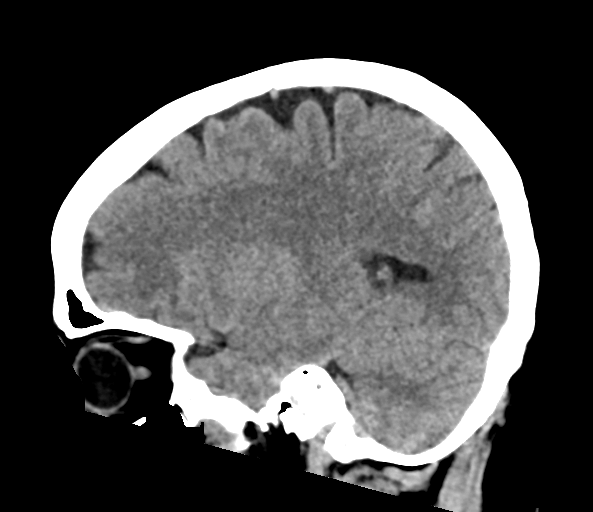

[17 of 47 positions shown; findings below may reference images not displayed]

FINDINGS: Brain: No evidence of acute infarction, hemorrhage, hydrocephalus,
extra-axial collection or mass lesion/mass effect.

Vascular: Negative for hyperdense vessel

Skull: Negative

Sinuses/Orbits: Paranasal sinuses clear.  Negative orbit

Other: None
IMPRESSION: Negative CT head

## 2022-12-29 ENCOUNTER — Emergency Department (HOSPITAL_COMMUNITY): Payer: No Typology Code available for payment source

## 2022-12-29 ENCOUNTER — Emergency Department (HOSPITAL_COMMUNITY)
Admission: EM | Admit: 2022-12-29 | Discharge: 2022-12-29 | Disposition: A | Discharge status: Home / Self Care | Payer: No Typology Code available for payment source | Attending: Emergency Medicine | Admitting: Emergency Medicine

## 2022-12-29 ENCOUNTER — Other Ambulatory Visit: Payer: Self-pay

## 2022-12-29 DIAGNOSIS — M25562 Pain in left knee: Secondary | ICD-10-CM

## 2022-12-29 DIAGNOSIS — S80212A Abrasion, left knee, initial encounter: Secondary | ICD-10-CM | POA: Diagnosis not present

## 2022-12-29 DIAGNOSIS — Y92410 Unspecified street and highway as the place of occurrence of the external cause: Secondary | ICD-10-CM | POA: Insufficient documentation

## 2022-12-29 DIAGNOSIS — S20212A Contusion of left front wall of thorax, initial encounter: Secondary | ICD-10-CM | POA: Diagnosis not present

## 2022-12-29 DIAGNOSIS — S299XXA Unspecified injury of thorax, initial encounter: Secondary | ICD-10-CM | POA: Diagnosis present

## 2022-12-29 DIAGNOSIS — S20219A Contusion of unspecified front wall of thorax, initial encounter: Secondary | ICD-10-CM

## 2022-12-29 LAB — TROPONIN I (HIGH SENSITIVITY)
Troponin I (High Sensitivity): <2 ng/L (ref <18)
Troponin I (High Sensitivity): <2 ng/L (ref <18)

## 2022-12-29 LAB — COMPREHENSIVE METABOLIC PANEL
ALT: 15 U/L (ref 0–44)
AST: 22 U/L (ref 15–41)
Albumin: 3.7 g/dL (ref 3.5–5.0)
Alkaline Phosphatase: 76 U/L (ref 38–126)
Anion gap: 9 (ref 5–15)
BUN: 12 mg/dL (ref 6–20)
CO2: 24 mmol/L (ref 22–32)
Calcium: 9.2 mg/dL (ref 8.9–10.3)
Chloride: 105 mmol/L (ref 98–111)
Creatinine, Ser: 0.62 mg/dL (ref 0.44–1.00)
GFR, Estimated: >60 mL/min (ref >60)
Glucose, Bld: 115 mg/dL — ABNORMAL HIGH (ref 70–99)
Potassium: 3.9 mmol/L (ref 3.5–5.1)
Sodium: 138 mmol/L (ref 135–145)
Total Bilirubin: 0.8 mg/dL (ref 0.3–1.2)
Total Protein: 7.1 g/dL (ref 6.5–8.1)

## 2022-12-29 LAB — CBC WITH DIFFERENTIAL/PLATELET
Abs Immature Granulocytes: 0.03 10*3/uL (ref 0.00–0.07)
Basophils Absolute: 0 10*3/uL (ref 0.0–0.1)
Basophils Relative: 0 %
Eosinophils Absolute: 0.1 10*3/uL (ref 0.0–0.5)
Eosinophils Relative: 2 %
HCT: 39 % (ref 36.0–46.0)
Hemoglobin: 13.1 g/dL (ref 12.0–15.0)
Immature Granulocytes: 1 %
Lymphocytes Relative: 31 %
Lymphs Abs: 1.8 10*3/uL (ref 0.7–4.0)
MCH: 30.2 pg (ref 26.0–34.0)
MCHC: 33.6 g/dL (ref 30.0–36.0)
MCV: 89.9 fL (ref 80.0–100.0)
Monocytes Absolute: 0.4 10*3/uL (ref 0.1–1.0)
Monocytes Relative: 7 %
Neutro Abs: 3.5 10*3/uL (ref 1.7–7.7)
Neutrophils Relative %: 59 %
Platelets: 296 10*3/uL (ref 150–400)
RBC: 4.34 MIL/uL (ref 3.87–5.11)
RDW: 13.7 % (ref 11.5–15.5)
WBC: 5.8 10*3/uL (ref 4.0–10.5)
nRBC: 0 % (ref 0.0–0.2)

## 2022-12-29 MED ORDER — MORPHINE SULFATE (PF) 4 MG/ML IV SOLN
4.0000 mg | Freq: Once | INTRAVENOUS | Status: AC
  Administered 2022-12-29: 4 mg via INTRAVENOUS
  Filled 2022-12-29: qty 1

## 2022-12-29 MED ORDER — ACETAMINOPHEN 500 MG PO TABS
1000.0000 mg | ORAL_TABLET | Freq: Once | ORAL | Status: AC
  Administered 2022-12-29: 1000 mg via ORAL
  Filled 2022-12-29: qty 2

## 2022-12-29 MED ORDER — LIDOCAINE 5 % EX PTCH: 1.0000 | MEDICATED_PATCH | CUTANEOUS | 0 refills | Status: AC

## 2022-12-29 MED ORDER — LIDOCAINE 5 % EX PTCH
1.0000 | MEDICATED_PATCH | Freq: Once | CUTANEOUS | Status: DC
  Administered 2022-12-29: 1 via TRANSDERMAL
  Filled 2022-12-29: qty 1

## 2022-12-29 MED ORDER — KETOROLAC TROMETHAMINE 15 MG/ML IJ SOLN
15.0000 mg | Freq: Once | INTRAMUSCULAR | Status: AC
  Administered 2022-12-29: 15 mg via INTRAVENOUS
  Filled 2022-12-29: qty 1

## 2022-12-29 MED ORDER — OXYCODONE HCL 5 MG PO TABS
5.0000 mg | ORAL_TABLET | Freq: Once | ORAL | Status: AC
  Administered 2022-12-29: 5 mg via ORAL
  Filled 2022-12-29: qty 1
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
## 2023-08-18 ENCOUNTER — Ambulatory Visit (HOSPITAL_COMMUNITY)
Admission: EM | Admit: 2023-08-18 | Discharge: 2023-08-18 | Disposition: A | Discharge status: Home / Self Care | Payer: Self-pay | Attending: Internal Medicine | Admitting: Internal Medicine

## 2023-08-18 ENCOUNTER — Encounter (HOSPITAL_COMMUNITY): Payer: Self-pay

## 2023-08-18 ENCOUNTER — Ambulatory Visit (INDEPENDENT_AMBULATORY_CARE_PROVIDER_SITE_OTHER): Payer: Self-pay

## 2023-08-18 DIAGNOSIS — M25561 Pain in right knee: Secondary | ICD-10-CM

## 2023-08-18 MED ORDER — METHYLPREDNISOLONE 4 MG PO TBPK: ORAL_TABLET | ORAL | 0 refills | Status: AC

## 2023-08-18 MED ORDER — TRAMADOL HCL 50 MG PO TABS: 50.0000 mg | ORAL_TABLET | Freq: Four times a day (QID) | ORAL | 0 refills | Status: AC | PRN

## 2023-08-18 NOTE — Discharge Instructions (Signed)
Si no se mejora hasta la semana siguiente, vaya aver a un especialista de huesos a [facility].
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
